# Patient Record
Sex: Male | Born: 1965 | Race: Black or African American | Hispanic: No | Marital: Single | Smoking: Current every day smoker
Health system: Southern US, Community
[De-identification: ages and names within clinical notes are randomized; demographics above are authoritative.]

## PROBLEM LIST (undated history)

## (undated) DIAGNOSIS — K279 Peptic ulcer, site unspecified, unspecified as acute or chronic, without hemorrhage or perforation: Secondary | ICD-10-CM

## (undated) DIAGNOSIS — K259 Gastric ulcer, unspecified as acute or chronic, without hemorrhage or perforation: Secondary | ICD-10-CM

---

## 2004-08-22 ENCOUNTER — Emergency Department: Payer: Self-pay | Admitting: Emergency Medicine

## 2004-10-18 ENCOUNTER — Emergency Department: Payer: Self-pay | Admitting: Emergency Medicine

## 2005-12-07 ENCOUNTER — Emergency Department: Payer: Self-pay | Admitting: Emergency Medicine

## 2006-03-24 ENCOUNTER — Emergency Department: Payer: Self-pay | Admitting: Emergency Medicine

## 2006-12-27 ENCOUNTER — Emergency Department: Payer: Self-pay | Admitting: Emergency Medicine

## 2011-05-02 ENCOUNTER — Emergency Department: Payer: Self-pay | Admitting: Emergency Medicine

## 2011-05-28 ENCOUNTER — Emergency Department: Payer: Self-pay | Admitting: Emergency Medicine

## 2011-07-22 ENCOUNTER — Emergency Department: Payer: Self-pay | Admitting: Emergency Medicine

## 2012-08-04 ENCOUNTER — Emergency Department: Payer: Self-pay | Admitting: Emergency Medicine

## 2012-08-04 LAB — CBC
HCT: 45.2 % (ref 40.0–52.0)
MCH: 28.4 pg (ref 26.0–34.0)
MCV: 87 fL (ref 80–100)
RBC: 5.18 10*6/uL (ref 4.40–5.90)
WBC: 14.3 10*3/uL — ABNORMAL HIGH (ref 3.8–10.6)

## 2012-08-04 LAB — BASIC METABOLIC PANEL
Anion Gap: 4 — ABNORMAL LOW (ref 7–16)
BUN: 11 mg/dL (ref 7–18)
Chloride: 104 mmol/L (ref 98–107)
Co2: 29 mmol/L (ref 21–32)
Creatinine: 1.21 mg/dL (ref 0.60–1.30)
EGFR (African American): >60
Osmolality: 273 (ref 275–301)
Potassium: 4.5 mmol/L (ref 3.5–5.1)
Sodium: 137 mmol/L (ref 136–145)

## 2012-08-07 LAB — BETA STREP CULTURE(ARMC)

## 2012-08-10 LAB — CULTURE, BLOOD (SINGLE)

## 2013-07-15 ENCOUNTER — Emergency Department (HOSPITAL_COMMUNITY)
Admission: EM | Admit: 2013-07-15 | Discharge: 2013-07-15 | Disposition: A | Discharge status: Home / Self Care | Payer: Self-pay | Attending: Emergency Medicine | Admitting: Emergency Medicine

## 2013-07-15 ENCOUNTER — Emergency Department (HOSPITAL_COMMUNITY): Payer: Self-pay

## 2013-07-15 ENCOUNTER — Encounter (HOSPITAL_COMMUNITY): Payer: Self-pay | Admitting: Emergency Medicine

## 2013-07-15 DIAGNOSIS — J209 Acute bronchitis, unspecified: Secondary | ICD-10-CM | POA: Insufficient documentation

## 2013-07-15 DIAGNOSIS — Z8719 Personal history of other diseases of the digestive system: Secondary | ICD-10-CM | POA: Insufficient documentation

## 2013-07-15 DIAGNOSIS — J40 Bronchitis, not specified as acute or chronic: Secondary | ICD-10-CM

## 2013-07-15 DIAGNOSIS — F17200 Nicotine dependence, unspecified, uncomplicated: Secondary | ICD-10-CM | POA: Insufficient documentation

## 2013-07-15 HISTORY — DX: Gastric ulcer, unspecified as acute or chronic, without hemorrhage or perforation: K25.9

## 2013-07-15 MED ORDER — ALBUTEROL SULFATE HFA 108 (90 BASE) MCG/ACT IN AERS
1.0000 | INHALATION_SPRAY | RESPIRATORY_TRACT | Status: DC | PRN
  Administered 2013-07-15: 2 via RESPIRATORY_TRACT
  Filled 2013-07-15: qty 6.7

## 2013-07-15 MED ORDER — PROMETHAZINE-CODEINE 6.25-10 MG/5ML PO SYRP: 5.0000 mL | ORAL_SOLUTION | Freq: Four times a day (QID) | ORAL | Status: AC | PRN

## 2013-07-15 MED ORDER — PREDNISONE 20 MG PO TABS: 40.0000 mg | ORAL_TABLET | Freq: Every day | ORAL | Status: DC

## 2013-07-15 NOTE — ED Provider Notes (Signed)
Medical screening examination/treatment/procedure(s) were performed by non-physician practitioner and as supervising physician I was immediately available for consultation/collaboration.   EKG Interpretation None        Rolan BuccoMelanie Jakai Risse, MD 07/15/13 1527

## 2013-07-15 NOTE — ED Provider Notes (Signed)
CSN: [id number]     Arrival date & time 07/15/13  1235 History   First MD Initiated Contact with Patient 07/15/13 1250     Chief Complaint  Patient presents with  . Cough   (Consider location/radiation/quality/duration/timing/severity/associated sxs/prior Treatment) Patient is a 48 y.o. male presenting with cough. The history is provided by the patient and medical records.  Cough  This is a 48 year old male with past medical history significant for gastric ulcers, presenting to the for productive cough over the past month. Denies fevers, chills, or body aches. Patient states the sputum varies in color, from clear to green. He states he has noticed streaks of blood in his sputum in the past, but none today. He has had a few isolated episodes of posttussive emesis when he coughed extremely hard, none in the past several days. He denies any chest pain or shortness of breath.  Patient has had recent sick contacts, wife being sick with similar symptoms. Patient is a daily smoker. Prior history of childhood asthma, no inhalers used in 30+ years.  States he has been taking NyQuil, Mucinex, and over-the-counter cough syrups without noted improvement.  VS stable on arrival.  Past Medical History  Diagnosis Date  . Gastric ulcer    History reviewed. No pertinent past surgical history. History reviewed. No pertinent family history. History  Substance Use Topics  . Smoking status: Current Every Day Smoker  . Smokeless tobacco: Not on file  . Alcohol Use: No    Review of Systems  Respiratory: Positive for cough.   All other systems reviewed and are negative.      Allergies  Review of patient's allergies indicates no known allergies.  Home Medications   Current Outpatient Rx  Name  Route  Sig  Dispense  Refill  . acetaminophen (TYLENOL) 500 MG tablet   Oral   Take 1,000 mg by mouth every 6 (six) hours as needed for headache.         . guaiFENesin (MUCINEX) 600 MG 12 hr tablet    Oral   Take 1,200 mg by mouth 2 (two) times daily as needed for to loosen phlegm.         . Pseudoeph-Doxylamine-DM-APAP (NYQUIL PO)   Oral   Take 30 mLs by mouth at bedtime as needed (flu like symptoms).          BP 115/86  Pulse 89  Temp(Src) 98.6 F (37 C) (Oral)  Resp 20  SpO2 97%  Physical Exam  Nursing note and vitals reviewed. Constitutional: He is oriented to person, place, and time. He appears well-developed and well-nourished. No distress.  HENT:  Head: Normocephalic and atraumatic.  Right Ear: Tympanic membrane and ear canal normal.  Left Ear: Tympanic membrane and ear canal normal.  Nose: Nose normal.  Mouth/Throat: Uvula is midline, oropharynx is clear and moist and mucous membranes are normal. No oropharyngeal exudate, posterior oropharyngeal edema, posterior oropharyngeal erythema or tonsillar abscesses.  Eyes: Conjunctivae and EOM are normal. Pupils are equal, round, and reactive to light.  Neck: Normal range of motion. Neck supple.  Cardiovascular: Normal rate, regular rhythm and normal heart sounds.   Pulmonary/Chest: Effort normal and breath sounds normal. No respiratory distress. He has no decreased breath sounds. He has no wheezes. He has no rhonchi.  Consistently coughing throughout exam, clear sputum production; no hemoptysis; no audible wheezes or rhonchi  Musculoskeletal: Normal range of motion.  Neurological: He is alert and oriented to person, place, and time.  Skin: Skin  is warm and dry. He is not diaphoretic.  Psychiatric: He has a normal mood and affect.    ED Course  Procedures (including critical care time) Labs Review Labs Reviewed - No data to display Imaging Review Dg Chest 2 View  07/15/2013   CLINICAL DATA:  One month history of for the duct of cough and mid chest pain, history of tobacco use  EXAM: CHEST  2 VIEW  COMPARISON:  None.  FINDINGS: The lungs are adequately inflated and clear. The cardiac silhouette is normal in size. The  mediastinum is normal in width. The pulmonary vascularity is not engorged. There is no pleural effusion or pneumothorax. The observed portions of the bony thorax are normal.  IMPRESSION: There is no evidence of pneumonia nor CHF nor other acute cardiopulmonary disease. One cannot exclude acute bronchitis in the appropriate clinical setting. If the patient's cough persists and remains unexplained, chest CT scanning may be useful given the smoking history.   Electronically Signed   By: David  [place]az[place]   On: 07/15/2013 13:50     EKG Interpretation None      MDM   Final diagnoses:  Bronchitis   Patient overall afebrile and nontoxic-appearing but with persistent productive cough.  No witnessed hemoptysis in the ED.  Patient likely with bronchitis.  Will start on prednisone, albuterol inhaler, and cough meds.  Discussed plan with pt, they agreed.  Return precautions advised.  Garlon HatchetLisa M Sol Englert, PA-C 07/15/13 1524

## 2013-07-15 NOTE — ED Notes (Signed)
Pt states he has had productive cough for past month without relief. Pt states sputum changes color, sometimes has streaks of blood. Pt states he has thrown up after coughing spell, pt states he is able to keep down fluids.

## 2013-07-15 NOTE — Discharge Instructions (Signed)
Take the prescribed medication as directed.  Use albuterol as needed for wheezing or shortness of breath. Return to the ED for new or worsening symptoms.

## 2014-04-14 ENCOUNTER — Emergency Department: Payer: Self-pay | Admitting: Student

## 2014-04-14 LAB — CBC WITH DIFFERENTIAL/PLATELET
Basophil #: 0.1 10*3/uL (ref 0.0–0.1)
Basophil %: 0.8 %
Eosinophil #: 0.3 10*3/uL (ref 0.0–0.7)
Eosinophil %: 3.9 %
HCT: 41.8 % (ref 40.0–52.0)
HGB: 13.6 g/dL (ref 13.0–18.0)
LYMPHS ABS: 2.2 10*3/uL (ref 1.0–3.6)
Lymphocyte %: 26 %
MCH: 28.7 pg (ref 26.0–34.0)
MCHC: 32.5 g/dL (ref 32.0–36.0)
MCV: 88 fL (ref 80–100)
MONO ABS: 1 x10 3/mm (ref 0.2–1.0)
MONOS PCT: 11.8 %
Neutrophil #: 4.8 10*3/uL (ref 1.4–6.5)
Neutrophil %: 57.5 %
Platelet: 300 10*3/uL (ref 150–440)
RBC: 4.73 10*6/uL (ref 4.40–5.90)
RDW: 14.3 % (ref 11.5–14.5)
WBC: 8.4 10*3/uL (ref 3.8–10.6)

## 2014-04-14 LAB — URIC ACID: Uric Acid: 3.9 mg/dL (ref 3.5–7.2)

## 2014-04-14 LAB — SEDIMENTATION RATE: ERYTHROCYTE SED RATE: 2 mm/h (ref 0–15)

## 2014-10-13 ENCOUNTER — Emergency Department
Admission: EM | Admit: 2014-10-13 | Discharge: 2014-10-13 | Disposition: A | Discharge status: Home / Self Care | Payer: Self-pay | Attending: Student | Admitting: Student

## 2014-10-13 DIAGNOSIS — Y9289 Other specified places as the place of occurrence of the external cause: Secondary | ICD-10-CM | POA: Insufficient documentation

## 2014-10-13 DIAGNOSIS — W208XXA Other cause of strike by thrown, projected or falling object, initial encounter: Secondary | ICD-10-CM | POA: Insufficient documentation

## 2014-10-13 DIAGNOSIS — Z720 Tobacco use: Secondary | ICD-10-CM | POA: Insufficient documentation

## 2014-10-13 DIAGNOSIS — S0181XA Laceration without foreign body of other part of head, initial encounter: Secondary | ICD-10-CM | POA: Insufficient documentation

## 2014-10-13 DIAGNOSIS — Z79899 Other long term (current) drug therapy: Secondary | ICD-10-CM | POA: Insufficient documentation

## 2014-10-13 DIAGNOSIS — S0083XA Contusion of other part of head, initial encounter: Secondary | ICD-10-CM

## 2014-10-13 DIAGNOSIS — Y998 Other external cause status: Secondary | ICD-10-CM | POA: Insufficient documentation

## 2014-10-13 DIAGNOSIS — Y9389 Activity, other specified: Secondary | ICD-10-CM | POA: Insufficient documentation

## 2014-10-13 MED ORDER — LIDOCAINE-EPINEPHRINE (PF) 1 %-1:200000 IJ SOLN
INTRAMUSCULAR | Status: AC
  Administered 2014-10-13: 19:00:00
  Filled 2014-10-13: qty 30

## 2014-10-13 MED ORDER — HYDROCODONE-ACETAMINOPHEN 5-325 MG PO TABS: 1.0000 | ORAL_TABLET | ORAL | Status: AC | PRN

## 2014-10-13 NOTE — ED Notes (Signed)
Patient with no complaints at this time. Respirations even and unlabored. Skin warm/dry. Discharge instructions reviewed with patient at this time. Patient given opportunity to voice concerns/ask questions. Patient discharged at this time and left Emergency Department with steady gait.   

## 2014-10-13 NOTE — ED Notes (Signed)
Pt states he was cutting down a tree and as the tree fell a limp hit him just above the left eye, pt has noted lac with some swelling , denies LOC, denies any visual changes or eye pain

## 2014-10-13 NOTE — ED Provider Notes (Signed)
[facility] Emergency Department Provider Note  ____________________________________________  Time seen: [id number]  I have reviewed the triage vital signs and the nursing notes.   HISTORY  Chief Complaint Head Injury   HPI Howard Sullivan is a 49 y.o. male has laceration to his left forehead after being hit with a tree. He states he is cutting down a tree limb that  hit him just above his left eye. He denies any loss of consciousnessor changes in his vision. He states he is current with tetanus. He rates his pain as a 6 out of 10. He has not taken any medication for pain. Nothing is making his pain better or worse. He is aware that laceration is over his left eyebrow and is currently refusing a CT scan of his face.   Past Medical History  Diagnosis Date  . Gastric ulcer     There are no active problems to display for this patient.   History reviewed. No pertinent past surgical history.  Current Outpatient Rx  Name  Route  Sig  Dispense  Refill  . acetaminophen (TYLENOL) 500 MG tablet   Oral   Take 1,000 mg by mouth every 6 (six) hours as needed for headache.         . guaiFENesin (MUCINEX) 600 MG 12 hr tablet   Oral   Take 1,200 mg by mouth 2 (two) times daily as needed for to loosen phlegm.         Marland Kitchen. HYDROcodone-acetaminophen (NORCO/VICODIN) 5-325 MG per tablet   Oral   Take 1 tablet by mouth every 4 (four) hours as needed for moderate pain.   20 tablet   0   . predniSONE (DELTASONE) 20 MG tablet   Oral   Take 2 tablets (40 mg total) by mouth daily. Take 40 mg by mouth daily for 3 days, then 20mg  by mouth daily for 3 days, then 10mg  daily for 3 days   12 tablet   0   . promethazine-codeine (PHENERGAN WITH CODEINE) 6.25-10 MG/5ML syrup   Oral   Take 5 mLs by mouth every 6 (six) hours as needed for cough.   100 mL   0   . Pseudoeph-Doxylamine-DM-APAP (NYQUIL PO)   Oral   Take 30 mLs by mouth at bedtime as needed (flu like  symptoms).           Allergies Crab  No family history on file.  Social History History  Substance Use Topics  . Smoking status: Current Every Day Smoker -- 0.50 packs/day    Types: Cigarettes  . Smokeless tobacco: Never Used  . Alcohol Use: Not on file    Review of Systems Constitutional: No fever/chills Eyes: No visual changes. Cardiovascular: Denies chest pain. Respiratory: Denies shortness of breath. Gastrointestinal: No abdominal pain.  No nausea, no vomiting.. Genitourinary: Negative for dysuria. Musculoskeletal: Negative for back pain. Skin: Negative for rash. Neurological: Negative for headaches, focal weakness or numbness.  10-point ROS otherwise negative.  ____________________________________________   PHYSICAL EXAM:  VITAL SIGNS: ED Triage Vitals  Enc Vitals Group     BP 10/13/14 1755 129/87 mmHg     Pulse Rate 10/13/14 1755 114     Resp 10/13/14 1755 18     Temp 10/13/14 1755 99 F (37.2 C)     Temp Source 10/13/14 1755 Oral     SpO2 10/13/14 1755 95 %     Weight 10/13/14 1755 217 lb (98.431 kg)     Height 10/13/14  1755  (1.854 m)     Head Cir --      Peak Flow --      Pain Score 10/13/14 1756 6     Pain Loc --      Pain Edu? --      Excl. in GC? --     Constitutional: Alert and oriented. Well appearing and in no acute distress. Eyes: Conjunctivae are normal. PERRL. EOMI. Head: Atraumatic. Nose: No congestion/rhinnorhea. Neck: No stridor.   Cardiovascular: Normal rate, regular rhythm. Grossly normal heart sounds.  Good peripheral circulation. Respiratory: Normal respiratory effort.  No retractions. Lungs CTAB. Gastrointestinal: Soft and nontender. No distention.. Musculoskeletal: No lower extremity tenderness nor edema.  No joint effusions. Both upper and lower extremitie without any difficulty. Neurologic:  Normal speech and language. No gross focal neurologic deficits are appreciated. Speech is normal. No gait instability. Skin:   Skin is warm, dry. There is a starburst looking laceration above left eyebrow. Bleeding is under control at present. No foreign bodies were noted. Patient also has a old scar in the same area. Psychiatric: Mood and affect are normal. Speech and behavior are normal.  ____________________________________________   LABS (all labs ordered are listed, but only abnormal results are displayed)  Labs Reviewed - No data to display ____________________________________________   RADIOLOGY  Patient refused CT scan ____________________________________________   PROCEDURES  Procedure(s) performed:LACERATION REPAIR Performed by: Tommi Rumps Authorized by: Tommi Rumps Consent: Verbal consent obtained. Risks and benefits: risks, benefits and alternatives were discussed Consent given by: patient Patient identity confirmed: provided demographic data Prepped and Draped in normal sterile fashion Wound explored  Laceration Location: Left eyebrow  Laceration Length: 4.0 cm  No Foreign Bodies seen or palpated  Anesthesia: local infiltration  Local anesthetic: lidocaine 1% % with epinephrine  Anesthetic total: 4.0 ml  Irrigation method: syringe Amount of cleaning: standard  Skin closure: 5-0 vicryl, 6-0 proline   Number of sutures: 3 subcutaneous sutures interrupted, 9 interrupted simple sutures exteriorly   Technique: Interrupted sutures as above.   Patient tolerance: Patient tolerated the procedure well with no immediate complications.   Critical Care performed: No  ____________________________________________   INITIAL IMPRESSION / ASSESSMENT AND PLAN / ED COURSE  Pertinent labs & imaging results that were available during my care of the patient were reviewed by me and considered in my medical decision making (see chart for details).  Patient continued to refuse CT scan of face. He is very talkative, laughing and in no distress. He tolerated laceration repair well.  He is given instructions for head injuries and told to return immediately if any problems such as confusion, vision changes, projectile vomiting or severe worsening. Sutures will remain in 7 days. ____________________________________________   FINAL CLINICAL IMPRESSION(S) / ED DIAGNOSES  Final diagnoses:  Facial laceration, initial encounter  Contusion of face, initial encounter      Tommi Rumps, PA-C 10/13/14 2251  Gayla Doss, MD 10/14/14 [phone]

## 2014-10-13 NOTE — Discharge Instructions (Signed)
Contusion A contusion is a deep bruise. Contusions happen when an injury causes bleeding under the skin. Signs of bruising include pain, puffiness (swelling), and discolored skin. The contusion may turn blue, purple, or yellow. HOME CARE   Put ice on the injured area.  Put ice in a plastic bag.  Place a towel between your skin and the bag.  Leave the ice on for 15-20 minutes, 03-04 times a day.  Only take medicine as told by your doctor.  Rest the injured area.  If possible, raise (elevate) the injured area to lessen puffiness. GET HELP RIGHT AWAY IF:   You have more bruising or puffiness.  You have pain that is getting worse.  Your puffiness or pain is not helped by medicine. MAKE SURE YOU:   Understand these instructions.  Will watch your condition.  Will get help right away if you are not doing well or get worse. Document Released: 10/14/2007 Document Revised: 07/20/2011 Document Reviewed: 03/02/2011 [facility] Patient Information 2015 [employer] [place]. This information is not intended to replace advice given to you by your health care provider. Make sure you discuss any questions you have with your health care provider.     READ INFORMATION ABOUT HEAD INJURIES AND RETURN IMMEDIATELY IF ANY CHANGES IN VISION, SEVERE HEADACHE, NAUSEA OR VOMTING. KEEP AREA CLEAN AND DRY.  RETURN IMMEDIATELY IF ANY SIGNS OF INFECTION SUCH AS REDNESS OR PUS WOUND CARE Please return in 7 days to have your stitches/staples removed or sooner if you have concerns.  Keep area clean and dry for 24 hours. Do not remove bandage, if applied.  After 24 hours, remove bandage and wash wound gently with mild soap and warm water. Reapply a new bandage after cleaning wound, if directed.  Continue daily cleansing with soap and water until stitches/staples are removed.  Do not apply any ointments or creams to the wound while stitches/staples are in place, as this may cause delayed healing.  Notify  the office if you experience any of the following signs of infection: Swelling, redness, pus drainage, streaking, fever >101.0 F  Notify the office if you experience excessive bleeding that does not stop after 15-20 minutes of constant, firm pressure.

## 2014-10-14 ENCOUNTER — Emergency Department: Payer: Self-pay

## 2014-10-14 ENCOUNTER — Emergency Department
Admission: EM | Admit: 2014-10-14 | Discharge: 2014-10-14 | Disposition: A | Discharge status: Home / Self Care | Payer: Self-pay | Attending: Emergency Medicine | Admitting: Emergency Medicine

## 2014-10-14 ENCOUNTER — Encounter: Payer: Self-pay | Admitting: Emergency Medicine

## 2014-10-14 DIAGNOSIS — Y9389 Activity, other specified: Secondary | ICD-10-CM | POA: Insufficient documentation

## 2014-10-14 DIAGNOSIS — S060X0A Concussion without loss of consciousness, initial encounter: Secondary | ICD-10-CM | POA: Insufficient documentation

## 2014-10-14 DIAGNOSIS — W208XXA Other cause of strike by thrown, projected or falling object, initial encounter: Secondary | ICD-10-CM | POA: Insufficient documentation

## 2014-10-14 DIAGNOSIS — Z720 Tobacco use: Secondary | ICD-10-CM | POA: Insufficient documentation

## 2014-10-14 DIAGNOSIS — R112 Nausea with vomiting, unspecified: Secondary | ICD-10-CM | POA: Insufficient documentation

## 2014-10-14 DIAGNOSIS — Y9289 Other specified places as the place of occurrence of the external cause: Secondary | ICD-10-CM | POA: Insufficient documentation

## 2014-10-14 DIAGNOSIS — Y998 Other external cause status: Secondary | ICD-10-CM | POA: Insufficient documentation

## 2014-10-14 MED ORDER — IBUPROFEN 600 MG PO TABS: 600.0000 mg | ORAL_TABLET | Freq: Once | ORAL | Status: DC

## 2014-10-14 MED ORDER — METOCLOPRAMIDE HCL 10 MG PO TABS
ORAL_TABLET | ORAL | Status: AC
  Administered 2014-10-14: 10 mg via ORAL
  Filled 2014-10-14: qty 1

## 2014-10-14 MED ORDER — METOCLOPRAMIDE HCL 10 MG PO TABS: 10.0000 mg | ORAL_TABLET | Freq: Four times a day (QID) | ORAL | Status: AC | PRN

## 2014-10-14 MED ORDER — METOCLOPRAMIDE HCL 10 MG PO TABS
10.0000 mg | ORAL_TABLET | Freq: Once | ORAL | Status: AC
  Administered 2014-10-14: 10 mg via ORAL

## 2014-10-14 MED ORDER — IBUPROFEN 600 MG PO TABS
ORAL_TABLET | ORAL | Status: AC
  Filled 2014-10-14: qty 1

## 2014-10-14 MED ORDER — MECLIZINE HCL 25 MG PO TABS: 25.0000 mg | ORAL_TABLET | Freq: Three times a day (TID) | ORAL | Status: AC | PRN

## 2014-10-14 NOTE — Discharge Instructions (Signed)
Concussion  A concussion, or closed-head injury, is a brain injury caused by a direct blow to the head or by a quick and sudden movement (jolt) of the head or neck. Concussions are usually not life-threatening. Even so, the effects of a concussion can be serious. If you have had a concussion before, you are more likely to experience concussion-like symptoms after a direct blow to the head.   CAUSES  · Direct blow to the head, such as from running into another player during a soccer game, being hit in a fight, or hitting your head on a hard surface.  · A jolt of the head or neck that causes the brain to move back and forth inside the skull, such as in a car crash.  SIGNS AND SYMPTOMS  The signs of a concussion can be hard to notice. Early on, they may be missed by you, family members, and health care providers. You may look fine but act or feel differently.  Symptoms are usually temporary, but they may last for days, weeks, or even longer. Some symptoms may appear right away while others may not show up for hours or days. Every head injury is different. Symptoms include:  · Mild to moderate headaches that will not go away.  · A feeling of pressure inside your head.  · Having more trouble than usual:  ¨ Learning or remembering things you have heard.  ¨ Answering questions.  ¨ Paying attention or concentrating.  ¨ Organizing daily tasks.  ¨ Making decisions and solving problems.  · Slowness in thinking, acting or reacting, speaking, or reading.  · Getting lost or being easily confused.  · Feeling tired all the time or lacking energy (fatigued).  · Feeling drowsy.  · Sleep disturbances.  ¨ Sleeping more than usual.  ¨ Sleeping less than usual.  ¨ Trouble falling asleep.  ¨ Trouble sleeping (insomnia).  · Loss of balance or feeling lightheaded or dizzy.  · Nausea or vomiting.  · Numbness or tingling.  · Increased sensitivity to:  ¨ Sounds.  ¨ Lights.  ¨ Distractions.  · Vision problems or eyes that tire  easily.  · Diminished sense of taste or smell.  · Ringing in the ears.  · Mood changes such as feeling sad or anxious.  · Becoming easily irritated or angry for little or no reason.  · Lack of motivation.  · Seeing or hearing things other people do not see or hear (hallucinations).  DIAGNOSIS  Your health care provider can usually diagnose a concussion based on a description of your injury and symptoms. He or she will ask whether you passed out (lost consciousness) and whether you are having trouble remembering events that happened right before and during your injury.  Your evaluation might include:  · A brain scan to look for signs of injury to the brain. Even if the test shows no injury, you may still have a concussion.  · Blood tests to be sure other problems are not present.  TREATMENT  · Concussions are usually treated in an emergency department, in urgent care, or at a clinic. You may need to stay in the hospital overnight for further treatment.  · Tell your health care provider if you are taking any medicines, including prescription medicines, over-the-counter medicines, and natural remedies. Some medicines, such as blood thinners (anticoagulants) and aspirin, may increase the chance of complications. Also tell your health care provider whether you have had alcohol or are taking illegal drugs. This information   may affect treatment.  · Your health care provider will send you home with important instructions to follow.  · How fast you will recover from a concussion depends on many factors. These factors include how severe your concussion is, what part of your brain was injured, your age, and how healthy you were before the concussion.  · Most people with mild injuries recover fully. Recovery can take time. In general, recovery is slower in older persons. Also, persons who have had a concussion in the past or have other medical problems may find that it takes longer to recover from their current injury.  HOME  CARE INSTRUCTIONS  General Instructions  · Carefully follow the directions your health care provider gave you.  · Only take over-the-counter or prescription medicines for pain, discomfort, or fever as directed by your health care provider.  · Take only those medicines that your health care provider has approved.  · Do not drink alcohol until your health care provider says you are well enough to do so. Alcohol and certain other drugs may slow your recovery and can put you at risk of further injury.  · If it is harder than usual to remember things, write them down.  · If you are easily distracted, try to do one thing at a time. For example, do not try to watch TV while fixing dinner.  · Talk with family members or close friends when making important decisions.  · Keep all follow-up appointments. Repeated evaluation of your symptoms is recommended for your recovery.  · Watch your symptoms and tell others to do the same. Complications sometimes occur after a concussion. Older adults with a brain injury may have a higher risk of serious complications, such as a blood clot on the brain.  · Tell your teachers, school nurse, school counselor, coach, athletic trainer, or work manager about your injury, symptoms, and restrictions. Tell them about what you can or cannot do. They should watch for:  ¨ Increased problems with attention or concentration.  ¨ Increased difficulty remembering or learning new information.  ¨ Increased time needed to complete tasks or assignments.  ¨ Increased irritability or decreased ability to cope with stress.  ¨ Increased symptoms.  · Rest. Rest helps the brain to heal. Make sure you:  ¨ Get plenty of sleep at night. Avoid staying up late at night.  ¨ Keep the same bedtime hours on weekends and weekdays.  ¨ Rest during the day. Take daytime naps or rest breaks when you feel tired.  · Limit activities that require a lot of thought or concentration. These include:  ¨ Doing homework or job-related  work.  ¨ Watching TV.  ¨ Working on the computer.  · Avoid any situation where there is potential for another head injury (football, hockey, soccer, basketball, martial arts, downhill snow sports and horseback riding). Your condition will get worse every time you experience a concussion. You should avoid these activities until you are evaluated by the appropriate follow-up health care providers.  Returning To Your Regular Activities  You will need to return to your normal activities slowly, not all at once. You must give your body and brain enough time for recovery.  · Do not return to sports or other athletic activities until your health care provider tells you it is safe to do so.  · Ask your health care provider when you can drive, ride a bicycle, or operate heavy machinery. Your ability to react may be slower after a   brain injury. Never do these activities if you are dizzy.  · Ask your health care provider about when you can return to work or school.  Preventing Another Concussion  It is very important to avoid another brain injury, especially before you have recovered. In rare cases, another injury can lead to permanent brain damage, brain swelling, or death. The risk of this is greatest during the first 7-10 days after a head injury. Avoid injuries by:  · Wearing a seat belt when riding in a car.  · Drinking alcohol only in moderation.  · Wearing a helmet when biking, skiing, skateboarding, skating, or doing similar activities.  · Avoiding activities that could lead to a second concussion, such as contact or recreational sports, until your health care provider says it is okay.  · Taking safety measures in your home.  ¨ Remove clutter and tripping hazards from floors and stairways.  ¨ Use grab bars in bathrooms and handrails by stairs.  ¨ Place non-slip mats on floors and in bathtubs.  ¨ Improve lighting in dim areas.  SEEK MEDICAL CARE IF:  · You have increased problems paying attention or  concentrating.  · You have increased difficulty remembering or learning new information.  · You need more time to complete tasks or assignments than before.  · You have increased irritability or decreased ability to cope with stress.  · You have more symptoms than before.  Seek medical care if you have any of the following symptoms for more than 2 weeks after your injury:  · Lasting (chronic) headaches.  · Dizziness or balance problems.  · Nausea.  · Vision problems.  · Increased sensitivity to noise or light.  · Depression or mood swings.  · Anxiety or irritability.  · Memory problems.  · Difficulty concentrating or paying attention.  · Sleep problems.  · Feeling tired all the time.  SEEK IMMEDIATE MEDICAL CARE IF:  · You have severe or worsening headaches. These may be a sign of a blood clot in the brain.  · You have weakness (even if only in one hand, leg, or part of the face).  · You have numbness.  · You have decreased coordination.  · You vomit repeatedly.  · You have increased sleepiness.  · One pupil is larger than the other.  · You have convulsions.  · You have slurred speech.  · You have increased confusion. This may be a sign of a blood clot in the brain.  · You have increased restlessness, agitation, or irritability.  · You are unable to recognize people or places.  · You have neck pain.  · It is difficult to wake you up.  · You have unusual behavior changes.  · You lose consciousness.  MAKE SURE YOU:  · Understand these instructions.  · Will watch your condition.  · Will get help right away if you are not doing well or get worse.  Document Released: 07/18/2003 Document Revised: 05/02/2013 Document Reviewed: 11/17/2012  Exit[employer]® Patient Information ©2015 [employer]. This information is not intended to replace advice given to you by your health care provider. Make sure you discuss any questions you have with your health care provider.

## 2014-10-14 NOTE — ED Notes (Signed)
Pt seen here yesterday for laceration repair to left side of forehead. Did not want any xrays done  At that time. Pt presents here today with dizziness that started after he got home yesterday and increased pain in head.

## 2014-10-14 NOTE — ED Provider Notes (Addendum)
[facility] Emergency Department Provider Note  ____________________________________________  Time seen: Approximately 1250 PM  I have reviewed the triage vital signs and the nursing notes.   HISTORY  Chief Complaint Dizziness    HPI Howard Sullivan is a 49 y.o. male seen here yesterday in the emergency department for trauma to the head who presents with worsening head pain, dizziness with nausea and vomiting. The patient was seen here yesterday and refused the CAT scan. He was sutured and then went home and last night had worsening pain with vomiting 2 and dizziness which is worse with movement. 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 He did not lose consciousness at that time. The pain is constant and moderate to severe at this time. Patient does not take any blood thinners. The patient is up-to-date with his tetanus shot. Tried Vicodin which he was prescribed yesterday without relief of his symptoms.   Past Medical History  Diagnosis Date  . Gastric ulcer     There are no active problems to display for this patient.   History reviewed. No pertinent past surgical history.  Current Outpatient Rx  Name  Route  Sig  Dispense  Refill  . HYDROcodone-acetaminophen (NORCO/VICODIN) 5-325 MG per tablet   Oral   Take 1 tablet by mouth every 4 (four) hours as needed for moderate pain.   20 tablet   0   . promethazine-codeine (PHENERGAN WITH CODEINE) 6.25-10 MG/5ML syrup   Oral   Take 5 mLs by mouth every 6 (six) hours as needed for cough.   100 mL   0     Allergies Crab  No family history on file.  Social History History  Substance Use Topics  . Smoking status: Current Every Day Smoker -- 0.50 packs/day    Types: Cigarettes  . Smokeless tobacco: Never Used  . Alcohol Use: Not on file    Review of Systems Constitutional: No fever/chills Eyes: No visual changes. ENT: No sore  throat. Cardiovascular: Denies chest pain. Respiratory: Denies shortness of breath. Gastrointestinal: As above  Genitourinary: Negative for dysuria. Musculoskeletal: Negative for back pain. Skin: Negative for rash. Neurological: Diffuse headache. No focal weakness or numbness.  10-point ROS otherwise negative.  ____________________________________________   PHYSICAL EXAM:  VITAL SIGNS: ED Triage Vitals  Enc Vitals Group     BP 10/14/14 1223 129/88 mmHg     Pulse Rate 10/14/14 1223 101     Resp 10/14/14 1223 20     Temp 10/14/14 1223 98.6 F (37 C)     Temp Source 10/14/14 1223 Oral     SpO2 10/14/14 1223 96 %     Weight 10/14/14 1213 217 lb (98.431 kg)     Height 10/14/14 1213 6\' 1"  (1.854 m)     Head Cir --      Peak Flow --      Pain Score 10/14/14 1214 9     Pain Loc --      Pain Edu? --      Excl. in GC? --     Constitutional: Alert and oriented. Well appearing and in no acute distress. Eyes: Conjunctivae are normal. PERRL. EOMI. Head: Left medial eyebrow laceration with good approximation. There is dried blood surrounding the 4 cm laceration. There is a 6 x 5 cm wheal of swelling. There is also a small amount of ecchymosis to the nose. There is tenderness only over the hematoma to  the left eye. There is no tenderness to the nose. The nasal bridge is not mobile.  Nose: No congestion/rhinnorhea. Mouth/Throat: Mucous membranes are moist.  Oropharynx non-erythematous. Neck: No stridor.   Cardiovascular: Normal rate, regular rhythm. Grossly normal heart sounds.  Good peripheral circulation. Respiratory: Normal respiratory effort.  No retractions. Lungs CTAB. Gastrointestinal: Soft and nontender. No distention. No abdominal bruits. No CVA tenderness. Musculoskeletal: No lower extremity tenderness nor edema.  No joint effusions. Neurologic:  Normal speech and language. No gross focal neurologic deficits are appreciated. Speech is normal. No gait instability. Skin:  Skin  is warm, dry and intact. No rash noted. Psychiatric: Mood and affect are normal. Speech and behavior are normal.  ____________________________________________   LABS (all labs ordered are listed, but only abnormal results are displayed)  Labs Reviewed - No data to display ____________________________________________  EKG   ____________________________________________  RADIOLOGY  No acute intracranial abnormality. Small left frontal scalp hematoma. ____________________________________________   PROCEDURES    ____________________________________________   INITIAL IMPRESSION / ASSESSMENT AND PLAN / ED COURSE  Pertinent labs & imaging results that were available during my care of the patient were reviewed by me and considered in my medical decision making (see chart for details).  ----------------------------------------- 3:04 PM on 10/14/2014 -----------------------------------------  Patient in no acute distress but still says having pain with pressure diffusely to his head. Also associated nausea. Patient with concussive symptoms. Patient does not play any contact sports or drink. Advised patient to reduce his amount of screen time with television as well as his cell phone. Also advised to reduce reading in order to help with cognitive rest. Patient does not have a primary care doctor. We'll give follow-up with the [facility]. Advised the patient at the most important thing is that he does not reinjure himself. Patient has Vicodin at home for pain control. We'll discharge her with Reglan for control of nausea. ____________________________________________   FINAL CLINICAL IMPRESSION(S) / ED DIAGNOSES  Acute concussion. Return visit.    Myrna Blazer, MD 10/14/14 1505  No episodes of vomiting in the emergency department.  Myrna Blazer, MD 10/14/14 [phone]

## 2014-10-22 ENCOUNTER — Encounter: Payer: Self-pay | Admitting: Emergency Medicine

## 2014-10-22 ENCOUNTER — Emergency Department
Admission: EM | Admit: 2014-10-22 | Discharge: 2014-10-22 | Disposition: A | Discharge status: Home / Self Care | Payer: Self-pay | Attending: Emergency Medicine | Admitting: Emergency Medicine

## 2014-10-22 DIAGNOSIS — Z720 Tobacco use: Secondary | ICD-10-CM | POA: Insufficient documentation

## 2014-10-22 DIAGNOSIS — Z4802 Encounter for removal of sutures: Secondary | ICD-10-CM | POA: Insufficient documentation

## 2014-10-22 MED ORDER — BACITRACIN-NEOMYCIN-POLYMYXIN 400-5-5000 EX OINT
TOPICAL_OINTMENT | CUTANEOUS | Status: AC
  Filled 2014-10-22: qty 1

## 2014-10-22 MED ORDER — BACITRACIN-NEOMYCIN-POLYMYXIN OINTMENT TUBE
TOPICAL_OINTMENT | Freq: Once | CUTANEOUS | Status: AC
Start: 2014-10-22 — End: 2014-10-22
  Administered 2014-10-22: 17:00:00 via TOPICAL

## 2014-10-22 NOTE — Discharge Instructions (Signed)

## 2014-10-22 NOTE — ED Notes (Signed)
Here for suture removal

## 2014-10-22 NOTE — ED Provider Notes (Signed)
[facility] Emergency Department Provider Note  ____________________________________________  Time seen: 4:05 PM  I have reviewed the triage vital signs and the nursing notes.   HISTORY  Chief Complaint Suture / Staple Removal   HPI Howard Sullivan is a 49 y.o. male who presents to the emergency department for suture removal.Sutures were inserted 10/13/2014.   Past Medical History  Diagnosis Date  . Gastric ulcer     There are no active problems to display for this patient.   History reviewed. No pertinent past surgical history.  Current Outpatient Rx  Name  Route  Sig  Dispense  Refill  . HYDROcodone-acetaminophen (NORCO/VICODIN) 5-325 MG per tablet   Oral   Take 1 tablet by mouth every 4 (four) hours as needed for moderate pain.   20 tablet   0   . meclizine (ANTIVERT) 25 MG tablet   Oral   Take 1 tablet (25 mg total) by mouth 3 (three) times daily as needed for dizziness or nausea.   30 tablet   1   . metoCLOPramide (REGLAN) 10 MG tablet   Oral   Take 1 tablet (10 mg total) by mouth every 6 (six) hours as needed for nausea or vomiting.   12 tablet   1   . promethazine-codeine (PHENERGAN WITH CODEINE) 6.25-10 MG/5ML syrup   Oral   Take 5 mLs by mouth every 6 (six) hours as needed for cough.   100 mL   0     Allergies Crab  No family history on file.  Social History History  Substance Use Topics  . Smoking status: Current Every Day Smoker -- 0.50 packs/day    Types: Cigarettes  . Smokeless tobacco: Never Used  . Alcohol Use: No    Review of Systems  Constitutional: Denies fever.  HEENT: No change from baseline Respiratory: No cough or shortness of breath Musculoskeletal: No pain. Skin: healing wound; pain gradually resolving.  ____________________________________________   PHYSICAL EXAM:  VITAL SIGNS: ED Triage Vitals  Enc Vitals Group     BP 10/22/14 1511 141/87 mmHg     Pulse Rate 10/22/14 1511 81      Resp 10/22/14 1511 20     Temp 10/22/14 1511 98.4 F (36.9 C)     Temp Source 10/22/14 1511 Oral     SpO2 10/22/14 1511 98 %     Weight 10/22/14 1511 217 lb (98.431 kg)     Height 10/22/14 1511  (1.854 m)     Head Cir --      Peak Flow --      Pain Score 10/22/14 1510 0     Pain Loc --      Pain Edu? --      Excl. in GC? --       Constitutional: Appears well. No distress HEENT: Atraumtaic, normal appearance, EOMI, sclera normal, voice normal. Respiratory: Respirations even and unlabored.  Cardiovascular: Capillary refill normal. Peripheral pulses 2+ Musculoskeletal: Full ROM x 4. Skin: 9 sutures present in left forehead vertical through eyebrow. No evidence of infection or cellulitis. Neurovascular: Gait steady; Alert and oriented x 4.   PROCEDURES  Procedure(s) performed: SUTURE REMOVAL Performed by: RN  Consent: Verbal consent obtained. Patient identity confirmed: provided demographic data Time out: Immediately prior to procedure a "time out" was called to verify the correct patient, procedure, equipment, support staff and site/side marked as required.  Location details: Left forehead, through eyebrow  Wound Appearance: clean  Sutures/Staples Removed: 9  Facility: sutures placed in this facility Patient tolerance: Patient tolerated the procedure well with no immediate complications.    ____________________________________________   INITIAL IMPRESSION / ASSESSMENT AND PLAN / ED COURSE  Pertinent labs & imaging results that were available during my care of the patient were reviewed by me and considered in my medical decision making (see chart for details).  Wound care discussed. Patient advised to keep covered with sunscreen. Patient was advised to return to the ER for symptoms that change or worsen if unable to schedule an appointment with primary care.  ____________________________________________   FINAL CLINICAL IMPRESSION(S) / ED  DIAGNOSES  Final diagnoses:  Visit for suture removal     Chinita Pester, FNP 10/22/14 1615  Maurilio Lovely, MD 10/22/14 2351

## 2014-11-22 ENCOUNTER — Emergency Department
Admission: EM | Admit: 2014-11-22 | Discharge: 2014-11-22 | Disposition: A | Discharge status: Home / Self Care | Payer: Self-pay | Attending: Emergency Medicine | Admitting: Emergency Medicine

## 2014-11-22 DIAGNOSIS — Z4802 Encounter for removal of sutures: Secondary | ICD-10-CM | POA: Insufficient documentation

## 2014-11-22 DIAGNOSIS — Z720 Tobacco use: Secondary | ICD-10-CM | POA: Insufficient documentation

## 2014-11-22 NOTE — Discharge Instructions (Signed)
Continue to keep area clean and dry. Clean daily with soap and water, rinse and pat dry. Apply thin layer of topical antibiotic ointment such as Neosporin daily.  Follow Up with your primary care physician or the above next week. Return to the ER for new or worsening concerns.  Suture Removal, Care After Refer to this sheet in the next few weeks. These instructions provide you with information on caring for yourself after your procedure. Your health care provider may also give you more specific instructions. Your treatment has been planned according to current medical practices, but problems sometimes occur. Call your health care provider if you have any problems or questions after your procedure. WHAT TO EXPECT AFTER THE PROCEDURE After your stitches (sutures) are removed, it is typical to have the following:  Some discomfort and swelling in the wound area.  Slight redness in the area. HOME CARE INSTRUCTIONS   If you have skin adhesive strips over the wound area, do not take the strips off. They will fall off on their own in a few days. If the strips remain in place after 14 days, you may remove them.  Change any bandages (dressings) at least once a day or as directed by your health care provider. If the bandage sticks, soak it off with warm, soapy water.  Apply cream or ointment only as directed by your health care provider. If using cream or ointment, wash the area with soap and water 2 times a day to remove all the cream or ointment. Rinse off the soap and pat the area dry with a clean towel.  Keep the wound area dry and clean. If the bandage becomes wet or dirty, or if it develops a bad smell, change it as soon as possible.  Continue to protect the wound from injury.  Use sunscreen when out in the sun. New scars become sunburned easily. SEEK MEDICAL CARE IF:  You have increasing redness, swelling, or pain in the wound.  You see pus coming from the wound.  You have a fever.  You  notice a bad smell coming from the wound or dressing.  Your wound breaks open (edges not staying together). Document Released: 01/20/2001 Document Revised: 02/15/2013 Document Reviewed: 12/07/2012 [facility] Patient Information 2015 [place] [place]. This information is not intended to replace advice given to you by your health care provider. Make sure you discuss any questions you have with your health care provider.
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
## 2014-11-22 NOTE — ED Notes (Signed)
Suture removal from left eyebrow

## 2014-11-22 NOTE — ED Notes (Signed)
Sutures removed as ordered total 11

## 2015-01-24 ENCOUNTER — Encounter: Payer: Self-pay | Admitting: Urgent Care

## 2015-01-24 ENCOUNTER — Emergency Department
Admission: EM | Admit: 2015-01-24 | Discharge: 2015-01-24 | Disposition: A | Discharge status: Home / Self Care | Payer: Self-pay | Attending: Emergency Medicine | Admitting: Emergency Medicine

## 2015-01-24 ENCOUNTER — Emergency Department: Payer: Self-pay

## 2015-01-24 DIAGNOSIS — Z720 Tobacco use: Secondary | ICD-10-CM | POA: Insufficient documentation

## 2015-01-24 DIAGNOSIS — R079 Chest pain, unspecified: Secondary | ICD-10-CM | POA: Insufficient documentation

## 2015-01-24 HISTORY — DX: Peptic ulcer, site unspecified, unspecified as acute or chronic, without hemorrhage or perforation: K27.9

## 2015-01-24 LAB — CBC
HCT: 40.7 % (ref 40.0–52.0)
Hemoglobin: 13.2 g/dL (ref 13.0–18.0)
MCH: 27.6 pg (ref 26.0–34.0)
MCHC: 32.5 g/dL (ref 32.0–36.0)
MCV: 85.1 fL (ref 80.0–100.0)
Platelets: 306 10*3/uL (ref 150–440)
RBC: 4.78 MIL/uL (ref 4.40–5.90)
RDW: 14.2 % (ref 11.5–14.5)
WBC: 8.6 10*3/uL (ref 3.8–10.6)

## 2015-01-24 LAB — BASIC METABOLIC PANEL
Anion gap: 7 (ref 5–15)
BUN: 16 mg/dL (ref 6–20)
CALCIUM: 9.3 mg/dL (ref 8.9–10.3)
CO2: 25 mmol/L (ref 22–32)
Chloride: 108 mmol/L (ref 101–111)
Creatinine, Ser: 1.31 mg/dL — ABNORMAL HIGH (ref 0.61–1.24)
GFR calc Af Amer: >60 mL/min (ref >60)
GLUCOSE: 100 mg/dL — AB (ref 65–99)
Potassium: 3.2 mmol/L — ABNORMAL LOW (ref 3.5–5.1)
Sodium: 140 mmol/L (ref 135–145)

## 2015-01-24 LAB — TROPONIN I

## 2015-01-24 LAB — LIPASE, BLOOD: Lipase: 24 U/L (ref 22–51)

## 2015-01-24 NOTE — ED Notes (Signed)
Pt. Wanted friend to be called ([phone].  Pt. Stated he did not want to talk but wanted her to know he was here.  Called # no answer answer machine was full.

## 2015-01-24 NOTE — Discharge Instructions (Signed)

## 2015-01-24 NOTE — ED Notes (Signed)
Pt. States he will follow up [name], Howard Sullivan

## 2015-01-24 NOTE — ED Notes (Signed)
Called Sonya(friend of [phone] pt. Friend answered phone, told friend pt. Was here in ED.

## 2015-01-24 NOTE — ED Notes (Signed)
Patient presents via EMS with c/o CP. Patient was reported to be walking home when symptoms began - stopped at [employer] and called EMS. (+) SOB and nausea reported. Patient endorses cocaine and marijuana use on Sunday; denies any use tonight. Pain is exacerbated by deep inspiration. PMH significant for PUD only.

## 2015-01-24 NOTE — ED Notes (Signed)
Pt. States at around 2 am this morning pt. Was walking home from girlfriends house, pt. States he started having left chest pain.  Pt. States "I started to sweat a lot and became short of breath.  Pt. Denies cardiac hx.

## 2015-01-24 NOTE — ED Provider Notes (Signed)
[facility] Emergency Department Provider Note  ____________________________________________  Time seen: 3:30 AM  I have reviewed the triage vital signs and the nursing notes.   HISTORY  Chief Complaint No chief complaint on file.      HPI Howard Sullivan is a 49 y.o. male presents with left-sided chest pain with onset while walking today. Patient also admits to dyspnea and nausea. Of note patient admits to cocaine use 4 days ago. Patient denies any fever no nausea or vomiting.     Past Medical History  Diagnosis Date  . Gastric ulcer     There are no active problems to display for this patient.   No past surgical history on file.  Current Outpatient Rx  Name  Route  Sig  Dispense  Refill  . HYDROcodone-acetaminophen (NORCO/VICODIN) 5-325 MG per tablet   Oral   Take 1 tablet by mouth every 4 (four) hours as needed for moderate pain.   20 tablet   0   . meclizine (ANTIVERT) 25 MG tablet   Oral   Take 1 tablet (25 mg total) by mouth 3 (three) times daily as needed for dizziness or nausea.   30 tablet   1   . metoCLOPramide (REGLAN) 10 MG tablet   Oral   Take 1 tablet (10 mg total) by mouth every 6 (six) hours as needed for nausea or vomiting.   12 tablet   1   . promethazine-codeine (PHENERGAN WITH CODEINE) 6.25-10 MG/5ML syrup   Oral   Take 5 mLs by mouth every 6 (six) hours as needed for cough.   100 mL   0     Allergies Crab  No family history on file.  Social History Social History  Substance Use Topics  . Smoking status: Current Every Day Smoker -- 0.50 packs/day    Types: Cigarettes  . Smokeless tobacco: Never Used  . Alcohol Use: No    Review of Systems  Constitutional: Negative for fever. Eyes: Negative for visual changes. ENT: Negative for sore throat. Cardiovascular: Positive for chest pain. Respiratory: Negative for shortness of breath. Gastrointestinal: Negative for abdominal pain, vomiting and  diarrhea. Genitourinary: Negative for dysuria. Musculoskeletal: Negative for back pain. Skin: Negative for rash. Neurological: Negative for headaches, focal weakness or numbness.   10-point ROS otherwise negative.  ____________________________________________   PHYSICAL EXAM:  VITAL SIGNS: ED Triage Vitals  Enc Vitals Group     BP --      Pulse --      Resp --      Temp --      Temp src --      SpO2 --      Weight --      Height --      Head Cir --      Peak Flow --      Pain Score --      Pain Loc --      Pain Edu? --      Excl. in GC? --      Constitutional: Alert and oriented. Well appearing and in no distress. Eyes: Conjunctivae are normal. PERRL. Normal extraocular movements. ENT   Head: Normocephalic and atraumatic.   Nose: No congestion/rhinnorhea.   Mouth/Throat: Mucous membranes are moist.   Neck: No stridor. Hematological/Lymphatic/Immunilogical: No cervical lymphadenopathy. Cardiovascular: Normal rate, regular rhythm. Normal and symmetric distal pulses are present in all extremities. No murmurs, rubs, or gallops. Respiratory: Normal respiratory effort without tachypnea nor retractions. Breath sounds  are clear and equal bilaterally. No wheezes/rales/rhonchi. Gastrointestinal: Soft and nontender. No distention. There is no CVA tenderness. Genitourinary: deferred Musculoskeletal: Nontender with normal range of motion in all extremities. No joint effusions.  No lower extremity tenderness nor edema. Neurologic:  Normal speech and language. No gross focal neurologic deficits are appreciated. Speech is normal.  Skin:  Skin is warm, dry and intact. No rash noted. Psychiatric: Mood and affect are normal. Speech and behavior are normal. Patient exhibits appropriate insight and judgment.  ____________________________________________    LABS (pertinent positives/negatives)  Labs Reviewed  BASIC METABOLIC PANEL - Abnormal; Notable for the following:     Potassium 3.2 (*)    Glucose, Bld 100 (*)    Creatinine, Ser 1.31 (*)    All other components within normal limits  CBC  TROPONIN I  TROPONIN I  LIPASE, BLOOD    ____________________________________________   EKG  ED ECG REPORT I, BROWN, Barclay N, the attending physician, personally viewed and interpreted this ECG.   Date: 01/24/2015  EKG Time: 3:50 AM  Rate: 86  Rhythm: Normal sinus rhythm  Axis: None  Intervals: Normal  ST&T Change: None   ____________________________________________    RADIOLOGY     DG Chest Portable 1 View (Final result) Result time: 01/24/15 04:02:15   Final result by Rad Results In Interface (01/24/15 04:02:15)   Narrative:   CLINICAL DATA: Acute onset of chest pain 2 hours prior. Shortness of breath.  EXAM: PORTABLE CHEST - 1 VIEW  COMPARISON: 07/15/2013  FINDINGS: The cardiomediastinal contours are normal. The lungs are clear. Pulmonary vasculature is normal. No consolidation, pleural effusion, or pneumothorax. No acute osseous abnormalities are seen.  IMPRESSION: No acute pulmonary process.   Electronically Signed By: Rubye Oaks M.D. On: 01/24/2015 04:02            INITIAL IMPRESSION / ASSESSMENT AND PLAN / ED COURSE  Pertinent labs & imaging results that were available during my care of the patient were reviewed by me and considered in my medical decision making (see chart for details).  EKG unremarkable cardiac enzymes negative 2. Patient will be discharged home with cardiology outpatient follow-up. ____________________________________________   FINAL CLINICAL IMPRESSION(S) / ED DIAGNOSES  Final diagnoses:  Chest pain, unspecified chest pain type      Darci Current, MD 01/24/15 [phone]

## 2016-10-12 IMAGING — CT CT HEAD W/O CM
2 series · 14 of 30 positions shown, 16 images · non-contrast
Comparison: None

CLINICAL DATA: Struck by falling tree branch yesterday while
working in the yard, stitches above LEFT eye, re- few CT head
yesterday, worse pain and dizziness today since waking

EXAM:
CT HEAD WITHOUT CONTRAST
TECHNIQUE: Contiguous axial images were obtained from the base of the skull
through the vertex without intravenous contrast.

[Series 2: head wo · axial · 0.44mm/px · z∈[+492,+592]mm · 6 of 29 slices shown, 8 images]
[im 5/29  brain]
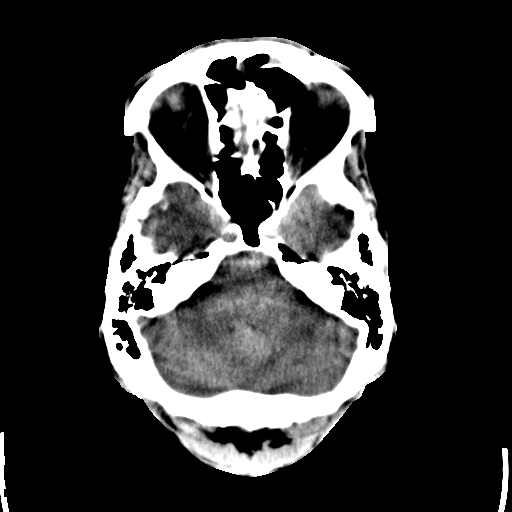
[im 5/29  bone]
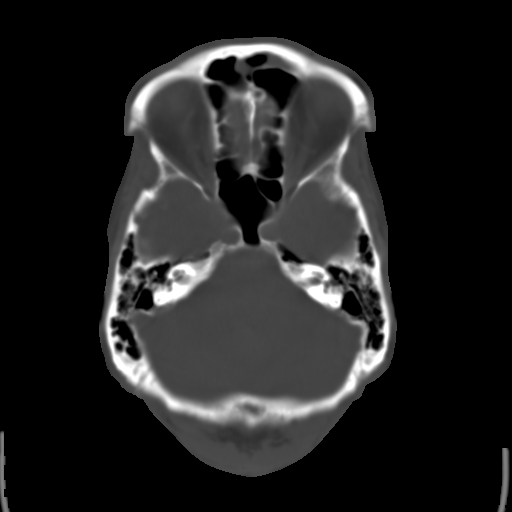
[im 9/29  brain]
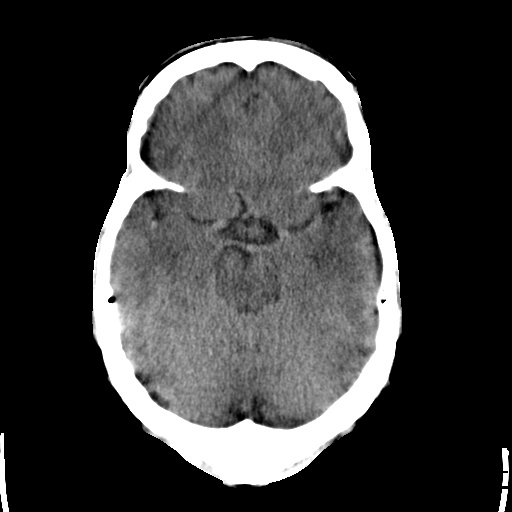
[im 13/29  brain]
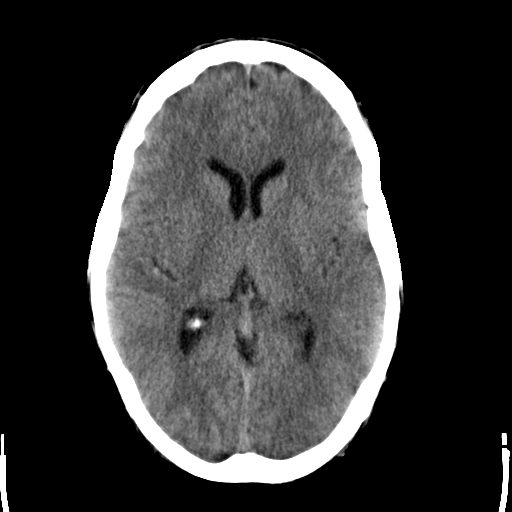
[im 17/29  brain]
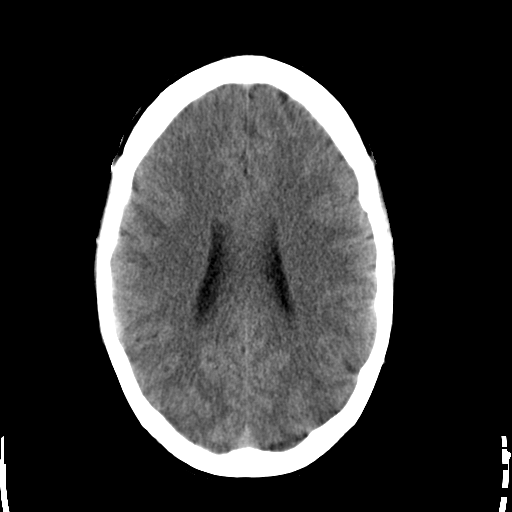
[im 21/29  brain]
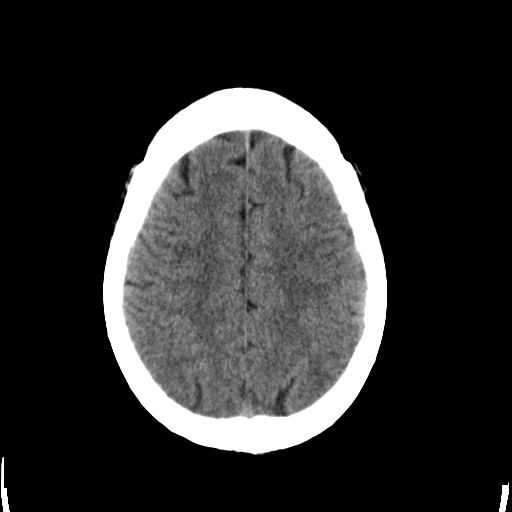
[im 21/29  bone]
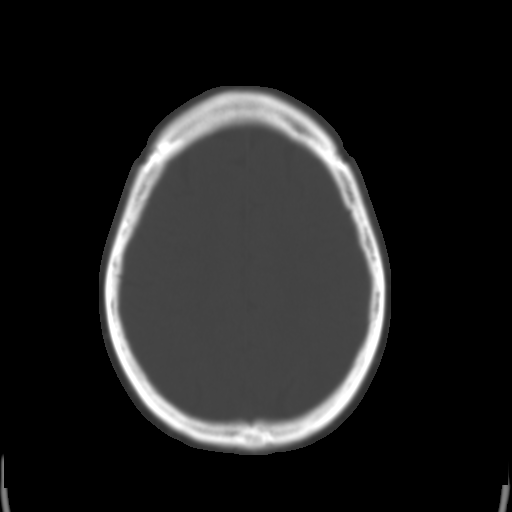
[im 25/29  brain]
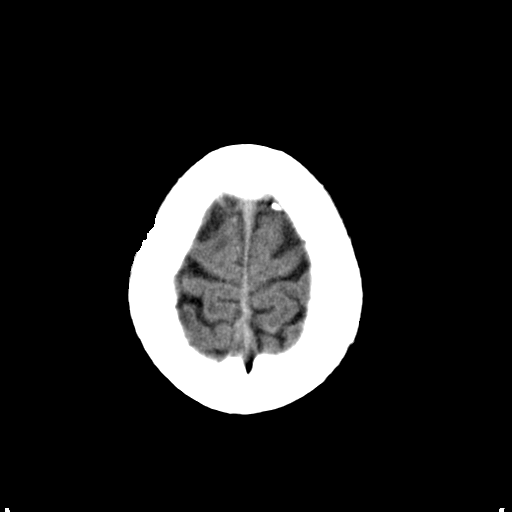

[Series 3: head bone · axial · 0.44mm/px · z∈[+476,+608]mm · 8 of 82 slices shown]
[im 8/82  bone]
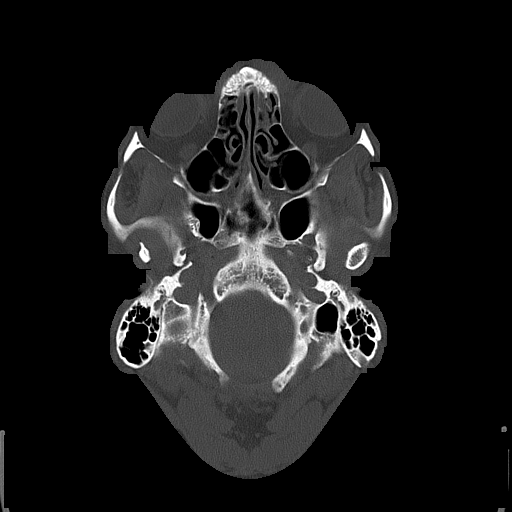
[im 16/82  bone]
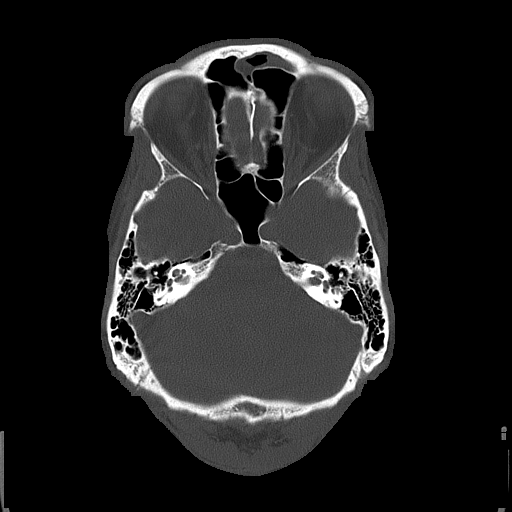
[im 28/82  bone]
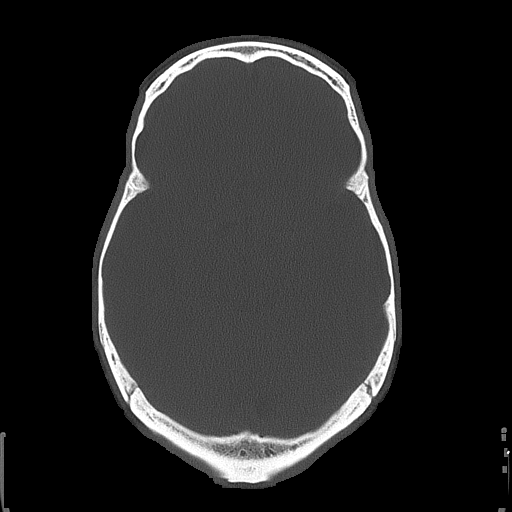
[im 35/82  bone]
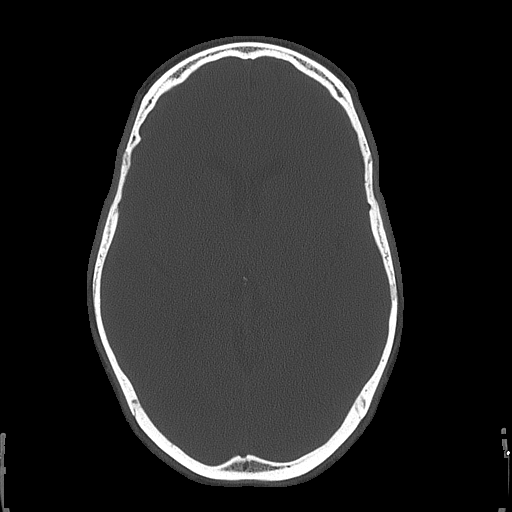
[im 47/82  bone]
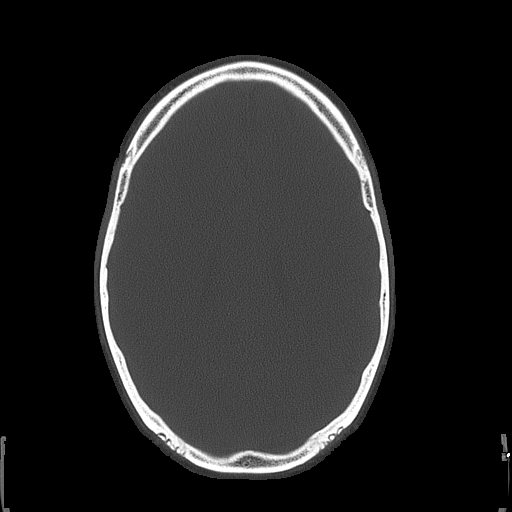
[im 55/82  bone]
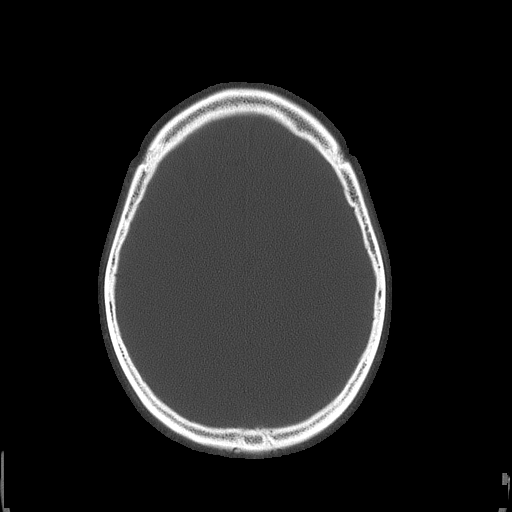
[im 66/82  bone]
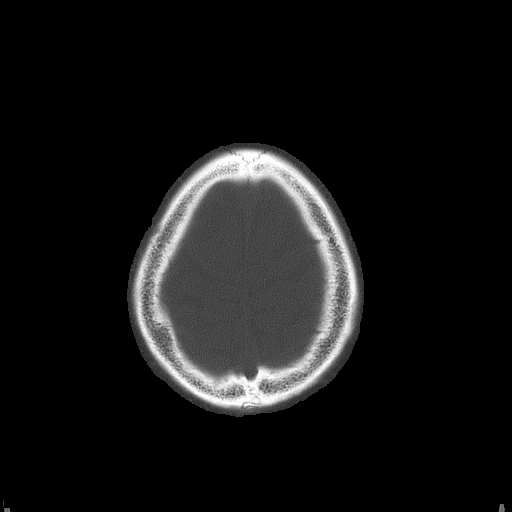
[im 74/82  bone]
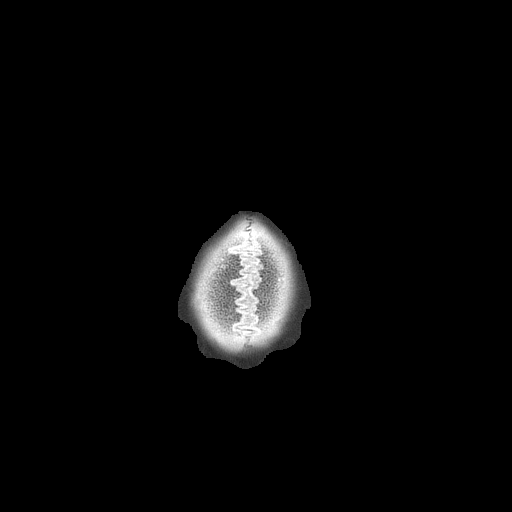

[14 of 30 positions shown; findings below may reference images not displayed]

FINDINGS: Normal ventricular morphology.

No midline shift or mass effect.

Probably normal appearance of brain parenchyma, scattered artifacts
noted.

No intracranial hemorrhage, mass lesion or evidence acute
infarction.

No extra-axial fluid collections.

Small LEFT frontal scalp hematoma.

Mucosal thickening and small amount of fluid within LEFT frontal
sinus.

Tiny focus of soft tissue gas LEFT supraorbital likely related to
laceration.

No fractures identified.
IMPRESSION: No acute intracranial abnormalities.

Small LEFT frontal scalp hematoma.

## 2017-01-22 IMAGING — CR DG CHEST 1V PORT
1 series · 2 of 2 positions shown · non-contrast
Comparison: 07/15/2013

CLINICAL DATA: Acute onset of chest pain 2 hours prior. Shortness
of breath.

EXAM:
PORTABLE CHEST - 1 VIEW

[Series 1: ap · 0.17mm/px · 2 of 2 slices shown]
[im 1/2]
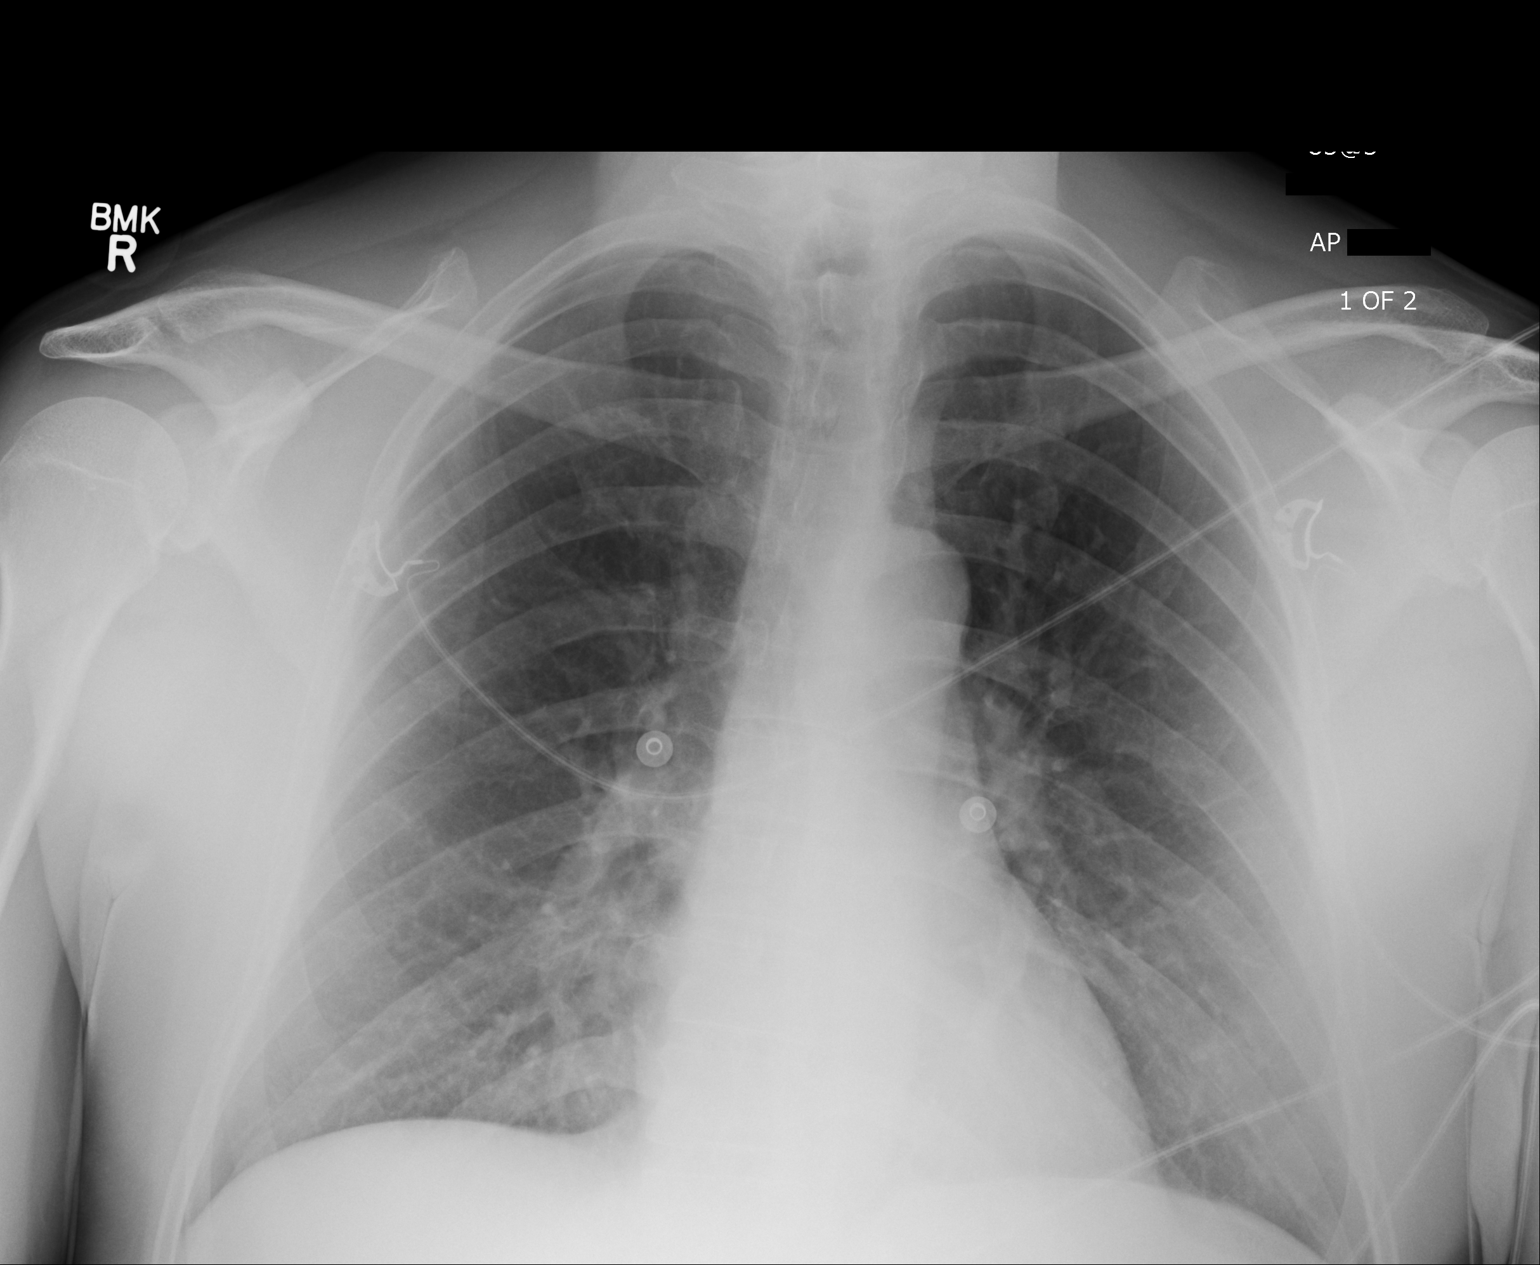
[im 2/2]
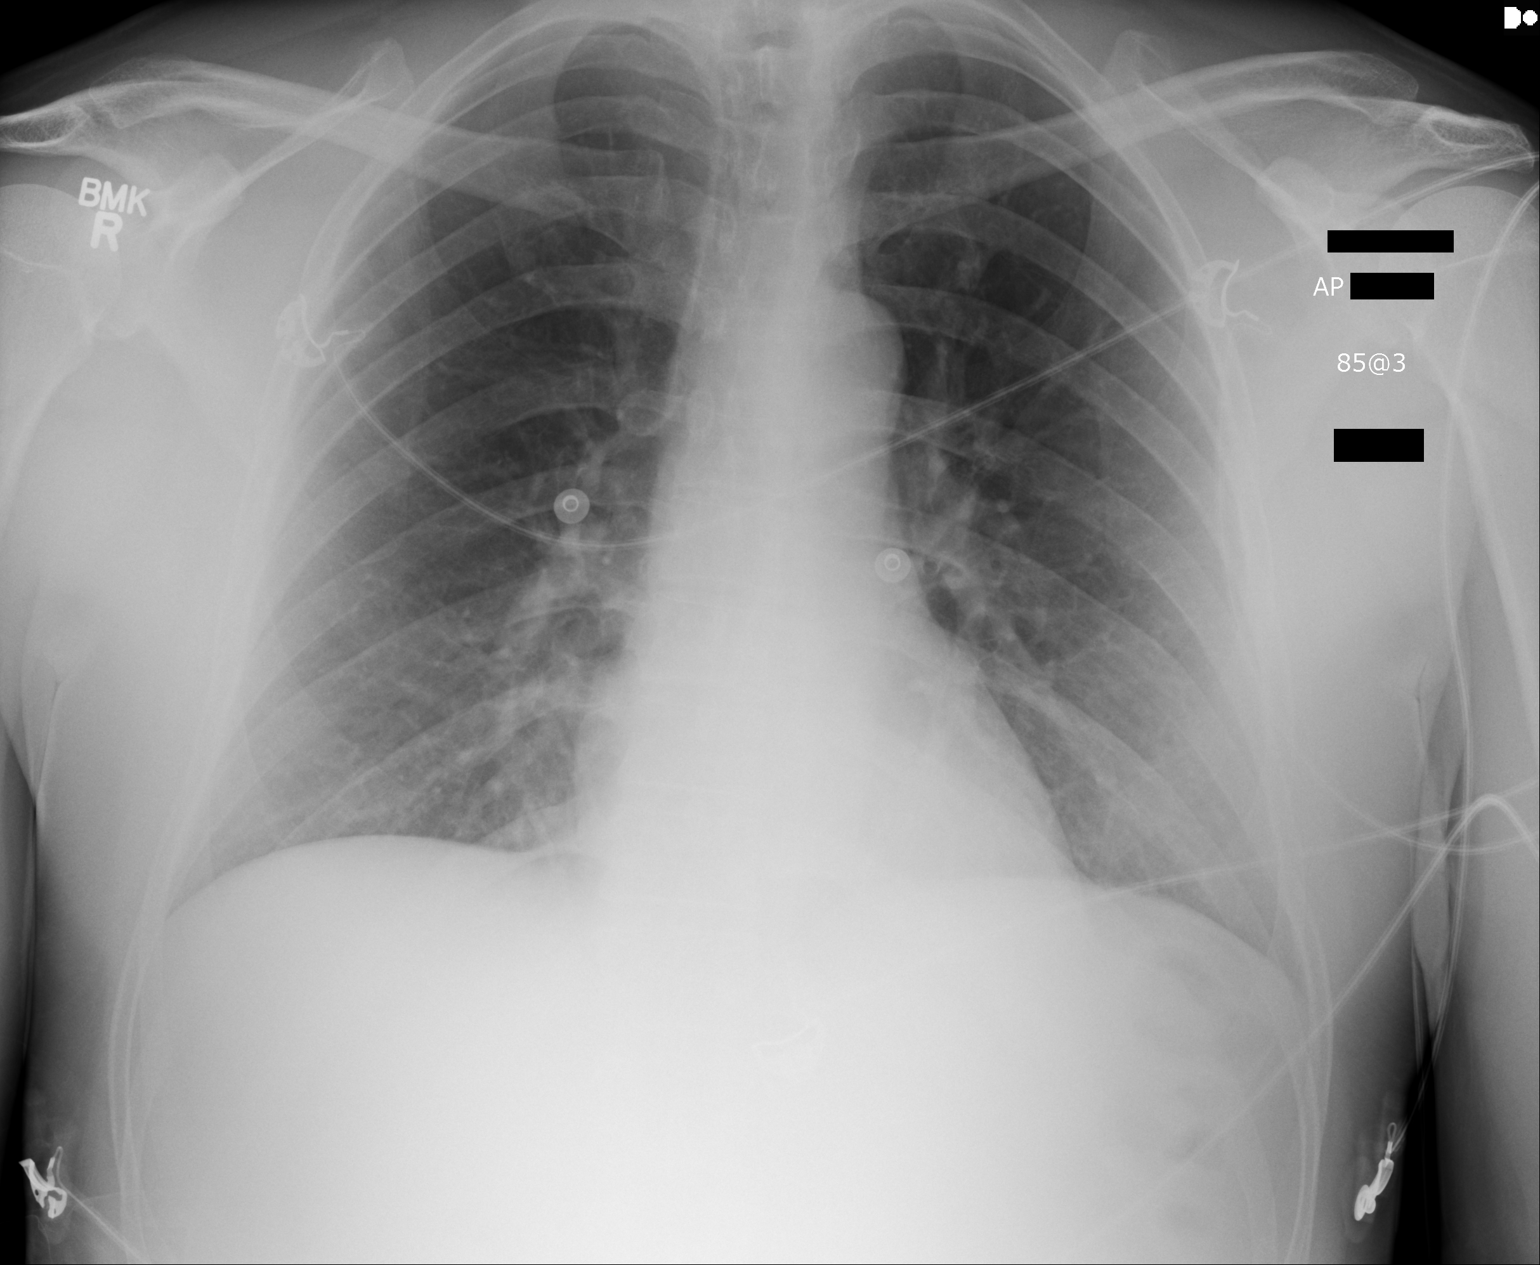

[2 of 2 positions shown; findings below may reference images not displayed]

FINDINGS: The cardiomediastinal contours are normal. The lungs are clear.
Pulmonary vasculature is normal. No consolidation, pleural effusion,
or pneumothorax. No acute osseous abnormalities are seen.
IMPRESSION: No acute pulmonary process.

## 2024-05-22 ENCOUNTER — Other Ambulatory Visit (HOSPITAL_COMMUNITY): Payer: Self-pay
